# Patient Record
Sex: Male | Born: 1985 | Race: White | Hispanic: No | Marital: Single
Health system: Southern US, Community
[De-identification: ages and names within clinical notes are randomized; demographics above are authoritative.]

---

## 2005-09-05 ENCOUNTER — Encounter: Admission: RE | Admit: 2005-09-05 | Discharge: 2005-09-05 | Payer: Self-pay | Admitting: Urology

## 2007-05-29 IMAGING — CT CT ANGIO ABDOMEN
1 of 7 series · 12 of 32 positions shown, 18 images · IV contrast ([ID] [id number])
Comparison: None.

CLINICAL DATA: Intermittent hematuria, rule out renal AV malformation.
 CT ANGIOGRAPHY OF ABDOMEN:
TECHNIQUE: Multidetector CT imaging of the abdomen was performed before and during bolus injection of intravenous contrast.  Multiplanar CT angiographic image reconstructions were generated to evaluate the vascular anatomy.
 Contrast:  125 cc of Omnipaque 350.

[Series 5: recon 2: renal/sma cta · axial · 0.62mm/px · z∈[-295,-76]mm · 12 of 415 slices shown, 18 images]
[im 32/415  soft-tissue]
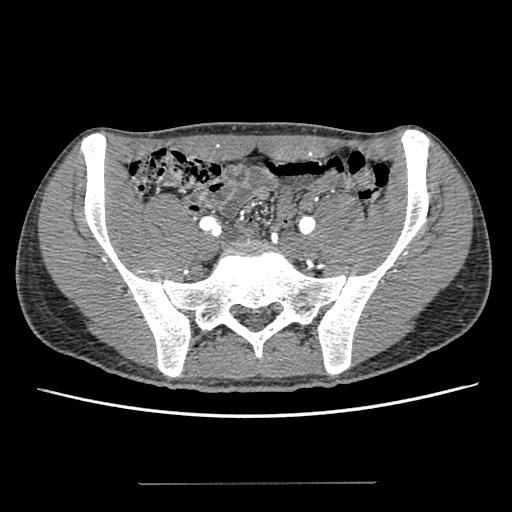
[im 32/415  bone]
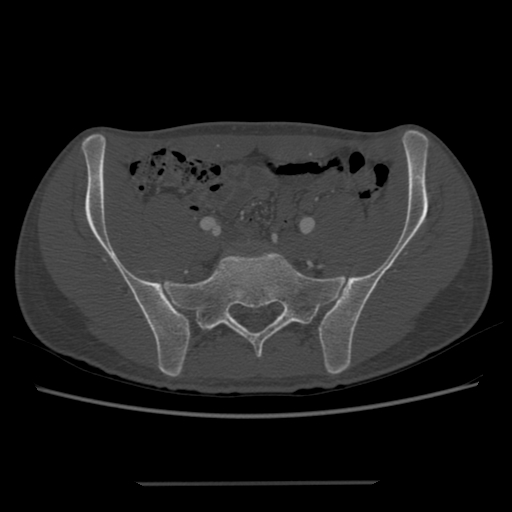
[im 64/415  soft-tissue]
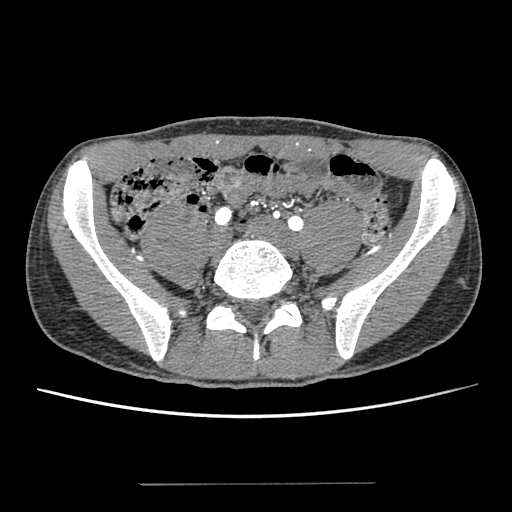
[im 96/415  soft-tissue]
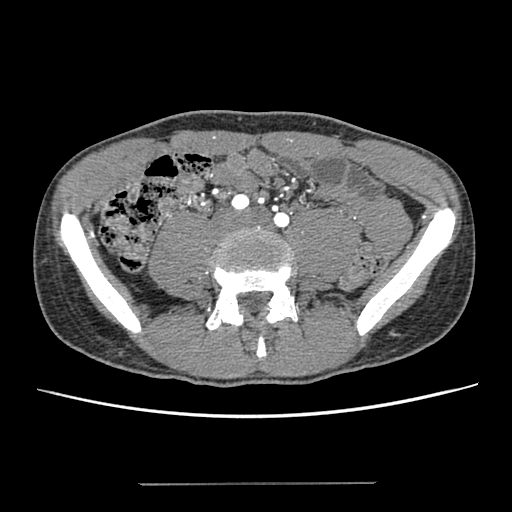
[im 128/415  soft-tissue]
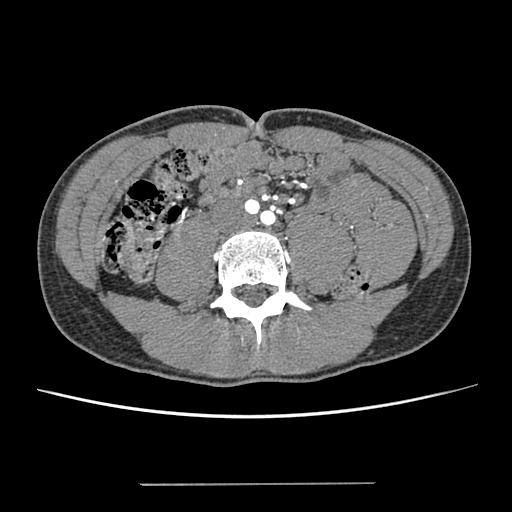
[im 160/415  soft-tissue]
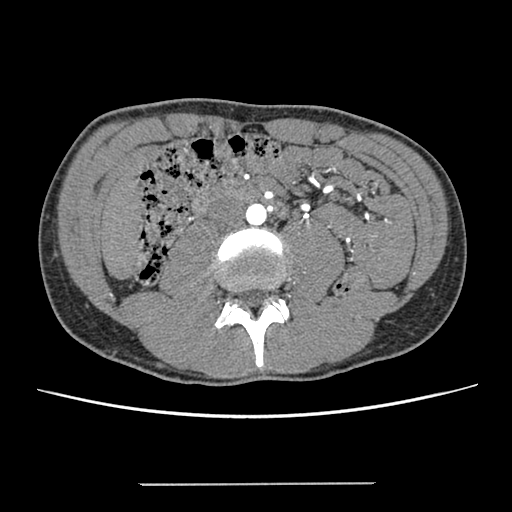
[im 192/415  soft-tissue]
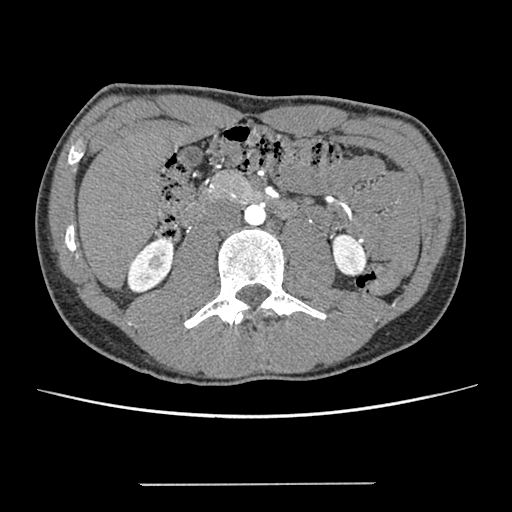
[im 223/415  soft-tissue]
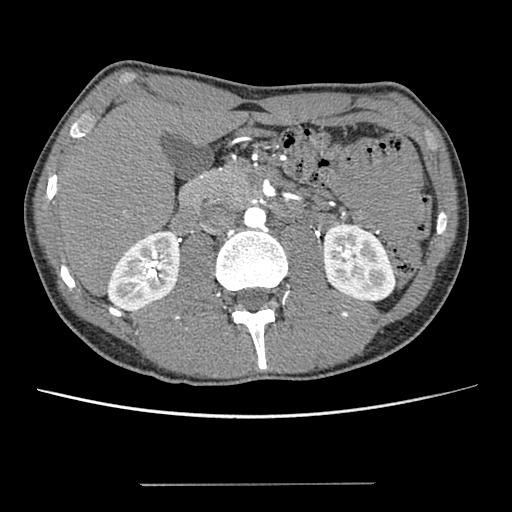
[im 255/415  soft-tissue]
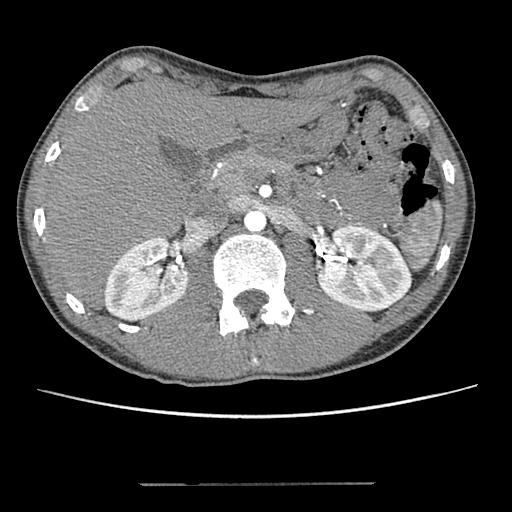
[im 287/415  soft-tissue]
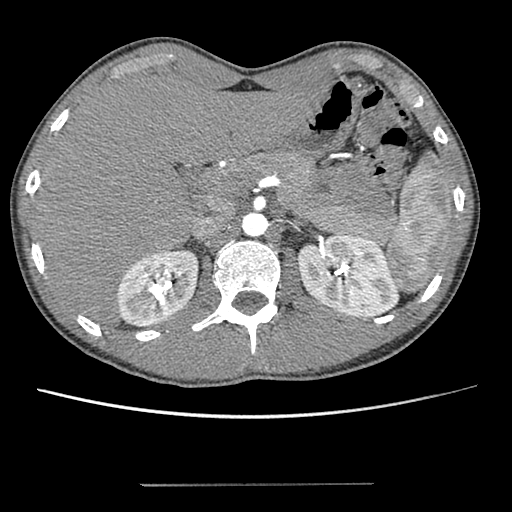
[im 287/415  lung]
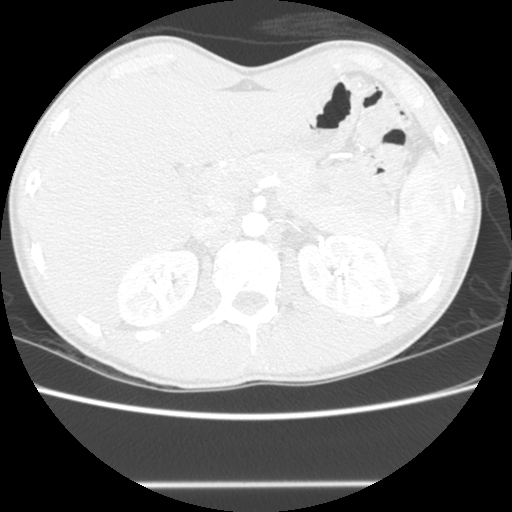
[im 287/415  bone]
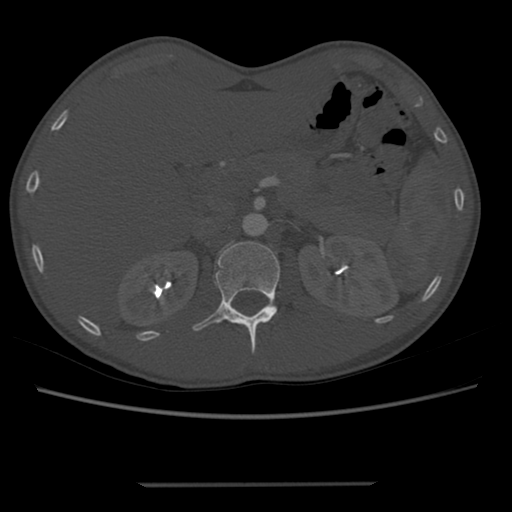
[im 319/415  soft-tissue]
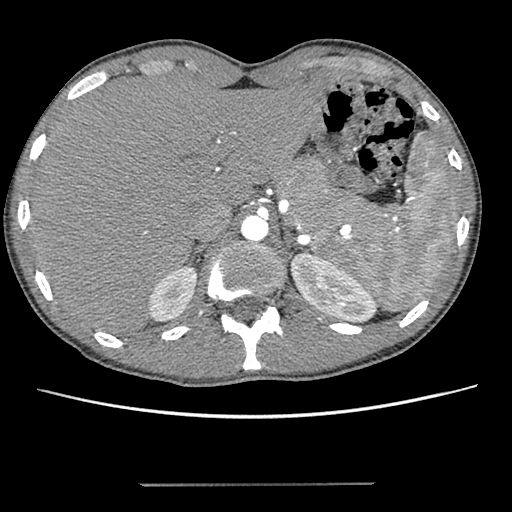
[im 319/415  lung]
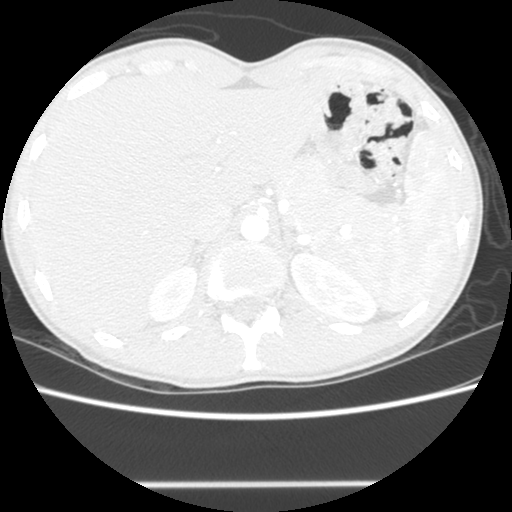
[im 351/415  soft-tissue]
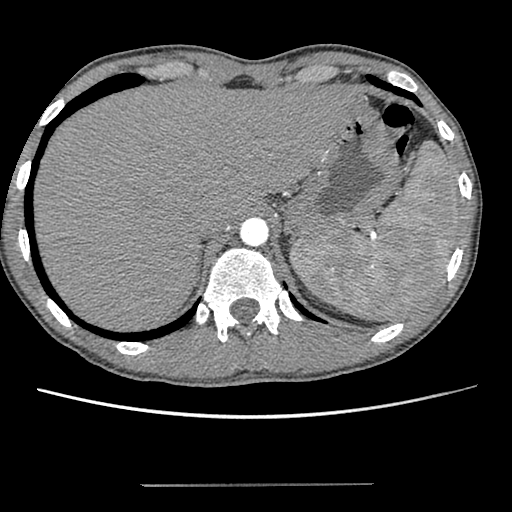
[im 351/415  lung]
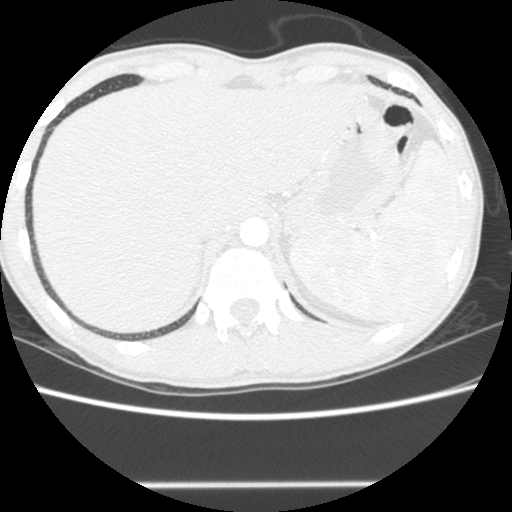
[im 383/415  soft-tissue]
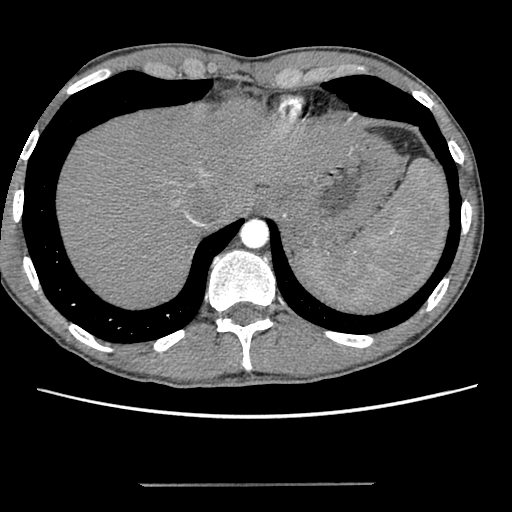
[im 383/415  lung]
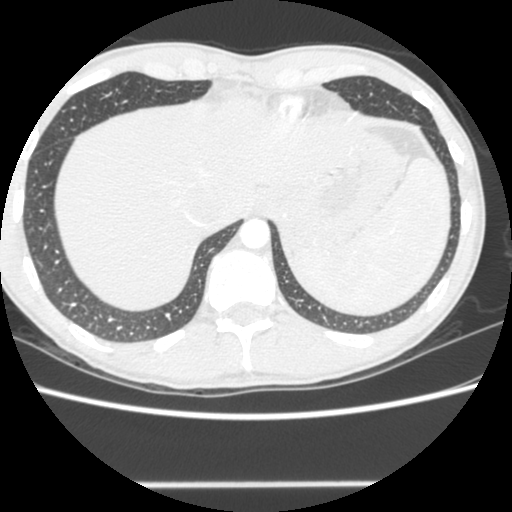

[12 of 32 positions shown; findings below may reference images not displayed]

FINDINGS: Single renal arteries are widely patent bilaterally.  There is no evidence of renal mass, renal artery aneurysm, or renal artery vascular malformation.  There is no early draining vein or early filling of the renal veins to suggest an occult vascular malformation.  No nephrolithiasis is identified.  Of note, there is early branching of the right renal artery.  The SMA, celiac, and IMA are patent. The visualized iliac vessels are patent.  The gallbladder pancreas, and adrenal glands are within normal limits.  The spleen is upper normal in size.  There is a 9 mm focal area of enhancement in the anterior segment of the right lobe of the liver on image 102, series 4, compatible with a small hyperenhancing liver lesion.  Negative free fluid.  Negative abnormal adenopathy.
IMPRESSION: 1.  No renal abnormality to cause hematuria.  Specifically, no nephrolithiasis, renal mass, or vascular anomaly.
 2.  9 mm enhancing lesion in the right lobe of the liver.  Considerations would include hemangioma, vascular abnormality, or, less likely, a hypervascular metastatic deposit.  The latter would be unlikely if there is no primary cancer.  As indicated, MRI of the liver can be performed to further delineate.

## 2019-05-30 ENCOUNTER — Telehealth: Payer: Self-pay | Admitting: "Endocrinology

## 2019-06-03 NOTE — Telephone Encounter (Signed)
Opened in error
